# Patient Record
Sex: Female | Born: 1960 | Race: Black or African American | Hispanic: No | Smoking: Never smoker
Health system: Southern US, Community
[De-identification: ages and names within clinical notes are randomized; demographics above are authoritative.]

## PROBLEM LIST (undated history)

## (undated) DIAGNOSIS — N951 Menopausal and female climacteric states: Secondary | ICD-10-CM

## (undated) DIAGNOSIS — IMO0002 Reserved for concepts with insufficient information to code with codable children: Secondary | ICD-10-CM

## (undated) HISTORY — PX: THROAT SURGERY: SHX803

## (undated) HISTORY — DX: Reserved for concepts with insufficient information to code with codable children: IMO0002

## (undated) HISTORY — DX: Menopausal and female climacteric states: N95.1

---

## 2001-08-03 ENCOUNTER — Encounter: Payer: Self-pay | Admitting: *Deleted

## 2001-08-03 ENCOUNTER — Emergency Department (HOSPITAL_COMMUNITY): Admission: EM | Admit: 2001-08-03 | Discharge: 2001-08-03 | Payer: Self-pay | Admitting: *Deleted

## 2001-08-18 ENCOUNTER — Encounter (HOSPITAL_COMMUNITY): Admission: RE | Admit: 2001-08-18 | Discharge: 2001-09-17 | Payer: Self-pay | Admitting: Preventative Medicine

## 2001-08-19 ENCOUNTER — Other Ambulatory Visit: Admission: RE | Admit: 2001-08-19 | Discharge: 2001-08-19 | Payer: Self-pay | Admitting: Family Medicine

## 2001-08-21 ENCOUNTER — Encounter: Payer: Self-pay | Admitting: Family Medicine

## 2001-08-21 ENCOUNTER — Ambulatory Visit (HOSPITAL_COMMUNITY): Admission: RE | Admit: 2001-08-21 | Discharge: 2001-08-21 | Payer: Self-pay | Admitting: Specialist

## 2001-11-13 ENCOUNTER — Emergency Department (HOSPITAL_COMMUNITY): Admission: EM | Admit: 2001-11-13 | Discharge: 2001-11-13 | Payer: Self-pay | Admitting: *Deleted

## 2002-08-25 ENCOUNTER — Ambulatory Visit (HOSPITAL_COMMUNITY): Admission: RE | Admit: 2002-08-25 | Discharge: 2002-08-25 | Payer: Self-pay | Admitting: Family Medicine

## 2002-08-25 ENCOUNTER — Encounter: Payer: Self-pay | Admitting: Family Medicine

## 2002-09-01 ENCOUNTER — Encounter: Payer: Self-pay | Admitting: Family Medicine

## 2002-09-01 ENCOUNTER — Ambulatory Visit (HOSPITAL_COMMUNITY): Admission: RE | Admit: 2002-09-01 | Discharge: 2002-09-01 | Payer: Self-pay | Admitting: Family Medicine

## 2003-09-07 ENCOUNTER — Ambulatory Visit (HOSPITAL_COMMUNITY): Admission: RE | Admit: 2003-09-07 | Discharge: 2003-09-07 | Payer: Self-pay | Admitting: Family Medicine

## 2007-09-14 ENCOUNTER — Other Ambulatory Visit: Admission: RE | Admit: 2007-09-14 | Discharge: 2007-09-14 | Payer: Self-pay | Admitting: Obstetrics and Gynecology

## 2007-09-22 ENCOUNTER — Ambulatory Visit (HOSPITAL_COMMUNITY): Admission: RE | Admit: 2007-09-22 | Discharge: 2007-09-22 | Payer: Self-pay | Admitting: Obstetrics and Gynecology

## 2008-10-24 ENCOUNTER — Other Ambulatory Visit: Admission: RE | Admit: 2008-10-24 | Discharge: 2008-10-24 | Payer: Self-pay | Admitting: Obstetrics and Gynecology

## 2009-12-06 ENCOUNTER — Other Ambulatory Visit: Admission: RE | Admit: 2009-12-06 | Discharge: 2009-12-06 | Payer: Self-pay | Admitting: Obstetrics and Gynecology

## 2009-12-18 ENCOUNTER — Ambulatory Visit (HOSPITAL_COMMUNITY): Admission: RE | Admit: 2009-12-18 | Discharge: 2009-12-18 | Payer: Self-pay | Admitting: Internal Medicine

## 2010-10-13 ENCOUNTER — Encounter: Payer: Self-pay | Admitting: Family Medicine

## 2011-12-02 ENCOUNTER — Other Ambulatory Visit (HOSPITAL_COMMUNITY)
Admission: RE | Admit: 2011-12-02 | Discharge: 2011-12-02 | Disposition: A | Discharge status: Home / Self Care | Payer: BC Managed Care – PPO | Source: Ambulatory Visit | Attending: Obstetrics and Gynecology | Admitting: Obstetrics and Gynecology

## 2011-12-02 ENCOUNTER — Other Ambulatory Visit: Payer: Self-pay | Admitting: Adult Health

## 2011-12-02 DIAGNOSIS — Z01419 Encounter for gynecological examination (general) (routine) without abnormal findings: Secondary | ICD-10-CM | POA: Insufficient documentation

## 2014-01-31 ENCOUNTER — Ambulatory Visit (INDEPENDENT_AMBULATORY_CARE_PROVIDER_SITE_OTHER): Payer: BC Managed Care – PPO | Admitting: Adult Health

## 2014-01-31 ENCOUNTER — Encounter: Payer: Self-pay | Admitting: Adult Health

## 2014-01-31 ENCOUNTER — Other Ambulatory Visit (HOSPITAL_COMMUNITY)
Admission: RE | Admit: 2014-01-31 | Discharge: 2014-01-31 | Disposition: A | Discharge status: Home / Self Care | Payer: BC Managed Care – PPO | Source: Ambulatory Visit | Attending: Adult Health | Admitting: Adult Health

## 2014-01-31 VITALS — BP 110/62 | HR 74 | Ht 62.0 in | Wt 148.0 lb

## 2014-01-31 DIAGNOSIS — Z01419 Encounter for gynecological examination (general) (routine) without abnormal findings: Secondary | ICD-10-CM | POA: Insufficient documentation

## 2014-01-31 DIAGNOSIS — N951 Menopausal and female climacteric states: Secondary | ICD-10-CM

## 2014-01-31 DIAGNOSIS — R87810 Cervical high risk human papillomavirus (HPV) DNA test positive: Secondary | ICD-10-CM | POA: Insufficient documentation

## 2014-01-31 DIAGNOSIS — Z1151 Encounter for screening for human papillomavirus (HPV): Secondary | ICD-10-CM | POA: Insufficient documentation

## 2014-01-31 DIAGNOSIS — Z1212 Encounter for screening for malignant neoplasm of rectum: Secondary | ICD-10-CM

## 2014-01-31 DIAGNOSIS — Z139 Encounter for screening, unspecified: Secondary | ICD-10-CM

## 2014-01-31 HISTORY — DX: Menopausal and female climacteric states: N95.1

## 2014-01-31 LAB — HEMOCCULT GUIAC POC 1CARD (OFFICE): Fecal Occult Blood, POC: NEGATIVE

## 2014-01-31 NOTE — Progress Notes (Signed)
Patient ID: Sarah Kirby L Kirby, female   DOB: March 24, 1961, 53 y.o.   MRN: [id number] History of Present Illness:  Sarah Kirby is a 53 year old black female in for a pap and physical.She is skipping periods and having a few hot flashes.  Current Medications, Allergies, Past Medical History, Past Surgical History, Family History and Social History were reviewed in [facility] electronic medical record.     Review of Systems: Patient denies any headaches, blurred vision, shortness of breath, chest pain, abdominal pain, problems with bowel movements, urination, or intercourse. No joint pain or mood swings, is working 12 hours.See HPI.    Physical Exam:BP 110/62  Pulse 74  Ht 5\' 2"  (1.575 m)  Wt 148 lb (67.132 kg)  BMI 27.06 kg/m2  LMP 12/01/2013 General:  Well developed, well nourished, no acute distress Skin:  Warm and dry Neck:  Midline trachea, normal thyroid Lungs; Clear to auscultation bilaterally Breast:  No dominant palpable mass, retraction, or nipple discharge Cardiovascular: Regular rate and rhythm Abdomen:  Soft, non tender, no hepatosplenomegaly Pelvic:  External genitalia is normal in appearance.  The vagina is normal in appearance. The cervix is bulbous.Pap with HPV performed.  Uterus is felt to be normal size, shape, and contour.  No                adnexal masses or tenderness noted. Rectal: Good sphincter tone, no polyps, or hemorrhoids felt.  Hemoccult negative. Extremities:  No swelling or varicosities noted Psych:  No mood changes, alert and cooperative,seems happy Discussed menopausal symptoms, will just watch for now.  Impression: Yearly gyn exam Peri menopausal symptoms    Plan: Physical in 1 year Mammogram yearly  Referred to Dr Darrick PennaFields for colonoscopy Had labs at work,TC 181,trig 56,HDL59,ration 3.1,VLDL 11,LDL 111,BS 78(12/15/13) Review handouts on perimenopause and menopause

## 2014-01-31 NOTE — Patient Instructions (Signed)
Menopause Menopause is the normal time of life when menstrual periods stop completely. Menopause is complete when you have missed 12 consecutive menstrual periods. It usually occurs between the ages of 48 years and 55 years. Very rarely does a woman develop menopause before the age of 40 years. At menopause, your ovaries stop producing the female hormones estrogen and progesterone. This can cause undesirable symptoms and also affect your health. Sometimes the symptoms may occur 4 5 years before the menopause begins. There is no relationship between menopause and:  Oral contraceptives.  Number of children you had.  Race.  The age your menstrual periods started (menarche). Heavy smokers and very thin women may develop menopause earlier in life. CAUSES  The ovaries stop producing the female hormones estrogen and progesterone.  Other causes include:  Surgery to remove both ovaries.  The ovaries stop functioning for no known reason.  Tumors of the pituitary gland in the brain.  Medical disease that affects the ovaries and hormone production.  Radiation treatment to the abdomen or pelvis.  Chemotherapy that affects the ovaries. SYMPTOMS   Hot flashes.  Night sweats.  Decrease in sex drive.  Vaginal dryness and thinning of the vagina causing painful intercourse.  Dryness of the skin and developing wrinkles.  Headaches.  Tiredness.  Irritability.  Memory problems.  Weight gain.  Bladder infections.  Hair growth of the face and chest.  Infertility. More serious symptoms include:  Loss of bone (osteoporosis) causing breaks (fractures).  Depression.  Hardening and narrowing of the arteries (atherosclerosis) causing heart attacks and strokes. DIAGNOSIS   When the menstrual periods have stopped for 12 straight months.  Physical exam.  Hormone studies of the blood. TREATMENT  There are many treatment choices and nearly as many questions about them. The  decisions to treat or not to treat menopausal changes is an individual choice made with your health care provider. Your health care provider can discuss the treatments with you. Together, you can decide which treatment will work best for you. Your treatment choices may include:   Hormone therapy (estrogen and progesterone).  Non-hormonal medicines.  Treating the individual symptoms with medicine (for example antidepressants for depression).  Herbal medicines that may help specific symptoms.  Counseling by a psychiatrist or psychologist.  Group therapy.  Lifestyle changes including:  Eating healthy.  Regular exercise.  Limiting caffeine and alcohol.  Stress management and meditation.  No treatment. HOME CARE INSTRUCTIONS   Take the medicine your health care provider gives you as directed.  Get plenty of sleep and rest.  Exercise regularly.  Eat a diet that contains calcium (good for the bones) and soy products (acts like estrogen hormone).  Avoid alcoholic beverages.  Do not smoke.  If you have hot flashes, dress in layers.  Take supplements, calcium, and vitamin D to strengthen bones.  You can use over-the-counter lubricants or moisturizers for vaginal dryness.  Group therapy is sometimes very helpful.  Acupuncture may be helpful in some cases. SEEK MEDICAL CARE IF:   You are not sure you are in menopause.  You are having menopausal symptoms and need advice and treatment.  You are still having menstrual periods after age 55 years.  You have pain with intercourse.  Menopause is complete (no menstrual period for 12 months) and you develop vaginal bleeding.  You need a referral to a specialist (gynecologist, psychiatrist, or psychologist) for treatment. SEEK IMMEDIATE MEDICAL CARE IF:   You have severe depression.  You have excessive vaginal bleeding.    You fell and think you have a broken bone.  You have pain when you urinate.  You develop leg or  chest pain.  You have a fast pounding heart beat (palpitations).  You have severe headaches.  You develop vision problems.  You feel a lump in your breast.  You have abdominal pain or severe indigestion. Document Released: 11/30/2003 Document Revised: 05/12/2013 Document Reviewed: 04/08/2013 [facility] Patient Information 2014 [employer], [place]. Perimenopause Perimenopause is the time when your body begins to move into the menopause (no menstrual period for 12 straight months). It is a natural process. Perimenopause can begin 2 8 years before the menopause and usually lasts for 1 year after the menopause. During this time, your ovaries may or may not produce an egg. The ovaries vary in their production of estrogen and progesterone hormones each month. This can cause irregular menstrual periods, difficulty getting pregnant, vaginal bleeding between periods, and uncomfortable symptoms. CAUSES  Irregular production of the ovarian hormones, estrogen and progesterone, and not ovulating every month.  Other causes include:  Tumor of the pituitary gland in the brain.  Medical disease that affects the ovaries.  Radiation treatment.  Chemotherapy.  Unknown causes.  Heavy smoking and excessive alcohol intake can bring on perimenopause sooner. SIGNS AND SYMPTOMS   Hot flashes.  Night sweats.  Irregular menstrual periods.  Decreased sex drive.  Vaginal dryness.  Headaches.  Mood swings.  Depression.  Memory problems.  Irritability.  Tiredness.  Weight gain.  Trouble getting pregnant.  The beginning of losing bone cells (osteoporosis).  The beginning of hardening of the arteries (atherosclerosis). DIAGNOSIS  Your health care provider will make a diagnosis by analyzing your age, menstrual history, and symptoms. He or she will do a physical exam and note any changes in your body, especially your female organs. Female hormone tests may or may not be helpful depending on the  amount of female hormones you produce and when you produce them. However, other hormone tests may be helpful to rule out other problems. TREATMENT  In some cases, no treatment is needed. The decision on whether treatment is necessary during the perimenopause should be made by you and your health care provider based on how the symptoms are affecting you and your lifestyle. Various treatments are available, such as:  Treating individual symptoms with a specific medicine for that symptom.  Herbal medicines that can help specific symptoms.  Counseling.  Group therapy. HOME CARE INSTRUCTIONS   Keep track of your menstrual periods (when they occur, how heavy they are, how long between periods, and how long they last) as well as your symptoms and when they started.  Only take over-the-counter or prescription medicines as directed by your health care provider.  Sleep and rest.  Exercise.  Eat a diet that contains calcium (good for your bones) and soy (acts like the estrogen hormone).  Do not smoke.  Avoid alcoholic beverages.  Take vitamin supplements as recommended by your health care provider. Taking vitamin E may help in certain cases.  Take calcium and vitamin D supplements to help prevent bone loss.  Group therapy is sometimes helpful.  Acupuncture may help in some cases. SEEK MEDICAL CARE IF:   You have questions about any symptoms you are having.  You need a referral to a specialist (gynecologist, psychiatrist, or psychologist). SEEK IMMEDIATE MEDICAL CARE IF:   You have vaginal bleeding.  Your period lasts longer than 8 days.  Your periods are recurring sooner than 21 days.  You  have bleeding after intercourse.  You have severe depression.  You have pain when you urinate.  You have severe headaches.  You have vision problems. Document Released: 10/17/2004 Document Revised: 06/30/2013 Document Reviewed: 04/08/2013 [facility] Patient Information 2014 [place],  [place]. Physical in 1 year Mammogram yearly Refer to Dr fields for colonosopy

## 2014-02-01 ENCOUNTER — Telehealth: Payer: Self-pay | Admitting: Adult Health

## 2014-02-01 NOTE — Telephone Encounter (Signed)
Left message to call about pap 

## 2014-02-02 ENCOUNTER — Telehealth: Payer: Self-pay | Admitting: Adult Health

## 2014-02-02 NOTE — Telephone Encounter (Signed)
Pt aware pap negative but +HPV will repeat pap in 1 year

## 2014-02-02 NOTE — Telephone Encounter (Signed)
Left message to call about pap 

## 2014-02-03 ENCOUNTER — Telehealth: Payer: Self-pay | Admitting: Adult Health

## 2014-02-04 NOTE — Telephone Encounter (Signed)
Pt states has further questions concerning results of pap showing HPV positive. Informed pt pap results were negative for any cell changes or malignancy but did detect the virus on the tissues, however her body can actually work to resolve the virus would need to f/u with a pap next year as a f/u.Pt verbalized understanding.

## 2014-07-25 ENCOUNTER — Encounter: Payer: Self-pay | Admitting: Adult Health

## 2014-08-02 ENCOUNTER — Other Ambulatory Visit: Payer: Self-pay | Admitting: Adult Health

## 2014-08-02 DIAGNOSIS — Z1231 Encounter for screening mammogram for malignant neoplasm of breast: Secondary | ICD-10-CM

## 2014-08-22 ENCOUNTER — Ambulatory Visit (HOSPITAL_COMMUNITY)
Admission: RE | Admit: 2014-08-22 | Discharge: 2014-08-22 | Disposition: A | Discharge status: Home / Self Care | Payer: BC Managed Care – PPO | Source: Ambulatory Visit | Attending: Adult Health | Admitting: Adult Health

## 2014-08-22 DIAGNOSIS — Z1231 Encounter for screening mammogram for malignant neoplasm of breast: Secondary | ICD-10-CM | POA: Insufficient documentation

## 2015-03-28 ENCOUNTER — Encounter: Payer: Self-pay | Admitting: Adult Health

## 2015-03-28 ENCOUNTER — Ambulatory Visit (INDEPENDENT_AMBULATORY_CARE_PROVIDER_SITE_OTHER): Payer: BLUE CROSS/BLUE SHIELD | Admitting: Adult Health

## 2015-03-28 ENCOUNTER — Other Ambulatory Visit (HOSPITAL_COMMUNITY)
Admission: RE | Admit: 2015-03-28 | Discharge: 2015-03-28 | Disposition: A | Discharge status: Home / Self Care | Payer: BLUE CROSS/BLUE SHIELD | Source: Ambulatory Visit | Attending: Adult Health | Admitting: Adult Health

## 2015-03-28 VITALS — BP 110/68 | HR 60 | Ht 62.5 in | Wt 137.5 lb

## 2015-03-28 DIAGNOSIS — IMO0002 Reserved for concepts with insufficient information to code with codable children: Secondary | ICD-10-CM

## 2015-03-28 DIAGNOSIS — Z01419 Encounter for gynecological examination (general) (routine) without abnormal findings: Secondary | ICD-10-CM | POA: Insufficient documentation

## 2015-03-28 DIAGNOSIS — Z1212 Encounter for screening for malignant neoplasm of rectum: Secondary | ICD-10-CM

## 2015-03-28 DIAGNOSIS — Z1151 Encounter for screening for human papillomavirus (HPV): Secondary | ICD-10-CM | POA: Diagnosis present

## 2015-03-28 HISTORY — DX: Reserved for concepts with insufficient information to code with codable children: IMO0002

## 2015-03-28 LAB — HEMOCCULT GUIAC POC 1CARD (OFFICE): FECAL OCCULT BLD: NEGATIVE

## 2015-03-28 NOTE — Progress Notes (Signed)
Patient ID: Sarah HartiganCherry L Kirby, female   DOB: 1961/04/01, 54 y.o.   MRN: [id number] History of Present Illness: Sarah SaxonCherry is a [age] year old black female in for well woman gyn exam and pap, her pap last year was normal with +HPV. [place]  Current Medications, Allergies, Past Medical History, Past Surgical History, Family History and Social History were reviewed in [facility] electronic medical record.     Review of Systems: Patient denies any headaches, hearing loss, fatigue, blurred vision, shortness of breath, chest pain, abdominal pain, problems with bowel movements, urination, or intercourse. No joint pain or mood swings.    Physical Exam:BP 110/68 mmHg  Pulse 60  Ht 5' 2.5" (1.588 m)  Wt 137 lb 8 oz (62.37 kg)  BMI 24.73 kg/m2  LMP 08/22/2014 General:  Well developed, well nourished, no acute distress Skin:  Warm and dry Neck:  Midline trachea, normal thyroid, good ROM, no lymphadenopathy Lungs; Clear to auscultation bilaterally Breast:  No dominant palpable mass, retraction, or nipple discharge Cardiovascular: Regular rate and rhythm Abdomen:  Soft, non tender, no hepatosplenomegaly Pelvic:  External genitalia is normal in appearance, no lesions.  The vagina is normal in appearance. Urethra has no lesions or masses. The cervix is bulbous,frible with EC brush, pap with HPV performed.  Uterus is felt to be normal size, shape, and contour.  No adnexal masses or tenderness noted.Bladder is non tender, no masses felt. Rectal: Good sphincter tone, no polyps, or hemorrhoids felt.  Hemoccult negative. 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

## 2015-03-28 NOTE — Patient Instructions (Signed)
Physical in 1 year Mammogram yearly Colonoscopy advised will talk when labs back

## 2015-03-29 LAB — CBC
HEMATOCRIT: 42.8 % (ref 34.0–46.6)
HEMOGLOBIN: 14.4 g/dL (ref 11.1–15.9)
MCH: 29.9 pg (ref 26.6–33.0)
MCHC: 33.6 g/dL (ref 31.5–35.7)
MCV: 89 fL (ref 79–97)
Platelets: 157 10*3/uL (ref 150–379)
RBC: 4.82 x10E6/uL (ref 3.77–5.28)
RDW: 13.6 % (ref 12.3–15.4)
WBC: 7.9 10*3/uL (ref 3.4–10.8)

## 2015-03-29 LAB — COMPREHENSIVE METABOLIC PANEL
ALK PHOS: 83 IU/L (ref 39–117)
ALT: 19 IU/L (ref 0–32)
AST: 19 IU/L (ref 0–40)
Albumin/Globulin Ratio: 1.4 (ref 1.1–2.5)
Albumin: 4.7 g/dL (ref 3.5–5.5)
BILIRUBIN TOTAL: 0.5 mg/dL (ref 0.0–1.2)
BUN / CREAT RATIO: 12 (ref 9–23)
BUN: 9 mg/dL (ref 6–24)
CHLORIDE: 102 mmol/L (ref 97–108)
CO2: 23 mmol/L (ref 18–29)
CREATININE: 0.77 mg/dL (ref 0.57–1.00)
Calcium: 9.3 mg/dL (ref 8.7–10.2)
GFR calc Af Amer: 102 mL/min/{1.73_m2} (ref >59)
GFR calc non Af Amer: 88 mL/min/{1.73_m2} (ref >59)
Globulin, Total: 3.3 g/dL (ref 1.5–4.5)
Glucose: 84 mg/dL (ref 65–99)
POTASSIUM: 4.1 mmol/L (ref 3.5–5.2)
SODIUM: 142 mmol/L (ref 134–144)
Total Protein: 8 g/dL (ref 6.0–8.5)

## 2015-03-29 LAB — LIPID PANEL
CHOL/HDL RATIO: 3.1 ratio (ref 0.0–4.4)
Cholesterol, Total: 177 mg/dL (ref 100–199)
HDL: 58 mg/dL (ref >39)
LDL Calculated: 100 mg/dL — ABNORMAL HIGH (ref 0–99)
Triglycerides: 95 mg/dL (ref 0–149)
VLDL Cholesterol Cal: 19 mg/dL (ref 5–40)

## 2015-03-29 LAB — TSH: TSH: 1.02 u[IU]/mL (ref 0.450–4.500)

## 2015-03-30 ENCOUNTER — Telehealth: Payer: Self-pay | Admitting: Adult Health

## 2015-03-30 LAB — CYTOLOGY - PAP

## 2015-03-30 NOTE — Telephone Encounter (Signed)
Left message labs good. 

## 2015-05-12 ENCOUNTER — Telehealth: Payer: Self-pay | Admitting: Adult Health

## 2015-05-12 NOTE — Telephone Encounter (Signed)
Left message I called, call me back on Monday

## 2015-05-22 ENCOUNTER — Telehealth: Payer: Self-pay | Admitting: Adult Health

## 2015-05-22 NOTE — Telephone Encounter (Signed)
Did not get seen at [facility],wants PCP, number given for Dr Jacqualyn Posey knot near rectum, not sure if hemorrhoid or not

## 2015-05-22 NOTE — Telephone Encounter (Signed)
Going to [facility]

## 2015-11-20 ENCOUNTER — Other Ambulatory Visit: Payer: Self-pay | Admitting: Adult Health

## 2015-11-20 DIAGNOSIS — Z1231 Encounter for screening mammogram for malignant neoplasm of breast: Secondary | ICD-10-CM

## 2015-12-11 ENCOUNTER — Ambulatory Visit (HOSPITAL_COMMUNITY)
Admission: RE | Admit: 2015-12-11 | Discharge: 2015-12-11 | Disposition: A | Discharge status: Home / Self Care | Payer: BLUE CROSS/BLUE SHIELD | Source: Ambulatory Visit | Attending: Adult Health | Admitting: Adult Health

## 2015-12-11 DIAGNOSIS — Z1231 Encounter for screening mammogram for malignant neoplasm of breast: Secondary | ICD-10-CM

## 2016-04-08 ENCOUNTER — Other Ambulatory Visit: Payer: BLUE CROSS/BLUE SHIELD | Admitting: Adult Health

## 2016-05-28 ENCOUNTER — Encounter: Payer: Self-pay | Admitting: Internal Medicine

## 2016-06-21 ENCOUNTER — Encounter: Payer: Self-pay | Admitting: Gastroenterology

## 2016-06-21 ENCOUNTER — Ambulatory Visit (INDEPENDENT_AMBULATORY_CARE_PROVIDER_SITE_OTHER): Payer: BLUE CROSS/BLUE SHIELD | Admitting: Gastroenterology

## 2016-06-21 DIAGNOSIS — R195 Other fecal abnormalities: Secondary | ICD-10-CM | POA: Insufficient documentation

## 2016-06-21 NOTE — Progress Notes (Signed)
Primary Care Physician:  Arlyss QueenSELVIDGE,WILLIAM M, MD Primary Gastroenterologist:  Dr. Jena Gaussourk   Chief Complaint  Patient presents with  . Colonoscopy    HPI:   Sarah Kirby is a 55 y.o. female presenting today at the request of her PCP for heme positive stool. She has never had a colonoscopy.  Has not seen bright red blood per rectum or melena. No changes in bowel habits, constipation, or diarrhea. No abdominal pain. Good appetite. Works all the time and states she had lost some weight with that but it has gotten to a baseline of where she normally stays. States she had been working 12 hour shifts and had lost weight but now back to her baseline of 140s. No upper GI symptoms. No prior colonoscopy. No FH of colon cancer or colon polyps.   Past Medical History:  Diagnosis Date  . Peri-menopausal 01/31/2014  . 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 THROAT SURGERY      No current outpatient prescriptions on file.   No current facility-administered medications for this visit.     Allergies as of 06/21/2016 - Review Complete 06/21/2016  Allergen Reaction Noted  . 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 Heart attack Mother   . Heart attack Father   . Birth defects Sister     down syndrome  . Birth defects Paternal Aunt   . Birth defects Paternal Aunt   . Birth defects Paternal Aunt     Social History   Social History  . Marital status: Divorced    Spouse name: N/A  . Number of children: N/A  . Years of education: N/A   Occupational History  . Not on file.   Social History Main Topics  . Smoking status: Never Smoker  . Smokeless tobacco: Never Used  . Alcohol use Yes     Comment: drinks wine in May  . Drug use: No  . Sexual activity: Not Currently    Birth control/ protection: Post-menopausal   Other Topics Concern  . Not on file    Social History Narrative  . No narrative on file    Review of Systems: Gen: Denies any fever, chills, fatigue, weight loss, lack of appetite.  CV: Denies chest pain, heart palpitations, peripheral edema, syncope.  Resp: Denies shortness of breath at rest or with exertion. Denies wheezing or cough.  GI: see HPI  GU : Denies urinary burning, urinary frequency, urinary hesitancy MS: Denies joint pain, muscle weakness, cramps, or limitation of movement.  Derm: Denies rash, itching, dry skin Psych: Denies depression, anxiety, memory loss, and confusion Heme: Denies bruising, bleeding, and enlarged lymph nodes.  Physical Exam: BP 112/70   Pulse 72   Temp 98 F (36.7 C) (Oral)   Ht 5' 2.5" (1.588 m)   Wt 141 lb 9.6 oz (64.2 kg)   LMP 08/22/2014   BMI 25.49 kg/m  General:   Alert and oriented. Pleasant and cooperative. Well-nourished and well-developed.  Head:  Normocephalic and atraumatic. Eyes:  Without icterus, sclera clear and conjunctiva pink.  Ears:  Normal auditory acuity. Nose:  No deformity, discharge,  or lesions. Mouth:  No deformity or lesions, oral mucosa pink.  Lungs:  Clear to auscultation bilaterally. No wheezes, rales, or rhonchi. No distress.  Heart:  S1, S2 present without murmurs appreciated.  Abdomen:  +BS, soft, non-tender and non-distended.  No HSM noted. No guarding or rebound. No masses appreciated.  Rectal:  Deferred  Msk:  Symmetrical without gross deformities. Normal posture. Extremities:  Without  edema. Neurologic:  Alert and  oriented x4;  grossly normal neurologically. Psych:  Alert and cooperative. Normal mood and affect.

## 2016-06-21 NOTE — Patient Instructions (Signed)
We have scheduled you for a colonoscopy with Dr. Rourk in the near future.  Further recommendations to follow!   

## 2016-06-21 NOTE — Assessment & Plan Note (Signed)
55 year old female with heme positive stool but no overt GI bleeding such as melena or hematochezia. No other concerning GI symptoms reported. No prior colonoscopy, family history of colon cancer or polyps.  Proceed with TCS with Dr. Jena Gaussourk in near future: the risks, benefits, and alternatives have been discussed with the patient in detail. The patient states understanding and desires to proceed.

## 2016-06-24 ENCOUNTER — Other Ambulatory Visit: Payer: Self-pay

## 2016-06-24 ENCOUNTER — Telehealth: Payer: Self-pay

## 2016-06-24 DIAGNOSIS — R195 Other fecal abnormalities: Secondary | ICD-10-CM

## 2016-06-24 MED ORDER — PEG 3350-KCL-NA BICARB-NACL 420 G PO SOLR: 4000.0000 mL | ORAL | 0 refills | Status: AC

## 2016-06-24 NOTE — Telephone Encounter (Signed)
Pt called to set-up TCS. TCS scheduled for 07/15/16 at 2:00 pm.

## 2016-06-24 NOTE — Progress Notes (Signed)
CC'D TO PCP °

## 2016-07-15 ENCOUNTER — Encounter (HOSPITAL_COMMUNITY): Payer: Self-pay | Admitting: *Deleted

## 2016-07-15 ENCOUNTER — Encounter (HOSPITAL_COMMUNITY)
Admission: RE | Disposition: A | Discharge status: Home / Self Care | Payer: Self-pay | Source: Ambulatory Visit | Attending: Internal Medicine

## 2016-07-15 ENCOUNTER — Ambulatory Visit (HOSPITAL_COMMUNITY)
Admission: RE | Admit: 2016-07-15 | Discharge: 2016-07-15 | Disposition: A | Discharge status: Home / Self Care | Payer: BLUE CROSS/BLUE SHIELD | Source: Ambulatory Visit | Attending: Internal Medicine | Admitting: Internal Medicine

## 2016-07-15 DIAGNOSIS — R195 Other fecal abnormalities: Secondary | ICD-10-CM | POA: Diagnosis not present

## 2016-07-15 HISTORY — PX: COLONOSCOPY: SHX5424

## 2016-07-15 LAB — HEMOGLOBIN AND HEMATOCRIT, BLOOD
HEMATOCRIT: 35.8 % — AB (ref 36.0–46.0)
Hemoglobin: 12.4 g/dL (ref 12.0–15.0)

## 2016-07-15 SURGERY — COLONOSCOPY
Anesthesia: Moderate Sedation

## 2016-07-15 MED ORDER — ONDANSETRON HCL 4 MG/2ML IJ SOLN
INTRAMUSCULAR | Status: DC | PRN
Start: 2016-07-15 — End: 2016-07-15
  Administered 2016-07-15: 4 mg via INTRAVENOUS

## 2016-07-15 MED ORDER — MIDAZOLAM HCL 5 MG/5ML IJ SOLN
INTRAMUSCULAR | Status: AC
  Filled 2016-07-15: qty 10

## 2016-07-15 MED ORDER — MEPERIDINE HCL 100 MG/ML IJ SOLN
INTRAMUSCULAR | Status: DC
Start: 2016-07-15 — End: 2016-07-15
  Filled 2016-07-15: qty 2

## 2016-07-15 MED ORDER — MIDAZOLAM HCL 5 MG/5ML IJ SOLN
INTRAMUSCULAR | Status: DC | PRN
  Administered 2016-07-15 (×2): 2 mg via INTRAVENOUS
  Administered 2016-07-15: 1 mg via INTRAVENOUS

## 2016-07-15 MED ORDER — ONDANSETRON HCL 4 MG/2ML IJ SOLN
INTRAMUSCULAR | Status: AC
  Filled 2016-07-15: qty 2

## 2016-07-15 MED ORDER — SODIUM CHLORIDE 0.9 % IV SOLN
INTRAVENOUS | Status: DC
  Administered 2016-07-15: 1000 mL via INTRAVENOUS

## 2016-07-15 MED ORDER — MEPERIDINE HCL 100 MG/ML IJ SOLN
INTRAMUSCULAR | Status: DC | PRN
  Administered 2016-07-15: 25 mg via INTRAVENOUS
  Administered 2016-07-15: 50 mg via INTRAVENOUS

## 2016-07-15 MED ORDER — STERILE WATER FOR IRRIGATION IR SOLN
Status: DC | PRN
  Administered 2016-07-15: 15:00:00

## 2016-07-15 NOTE — Discharge Instructions (Addendum)
°  Colonoscopy Discharge Instructions  Read the instructions outlined below and refer to this sheet in the next few weeks. These discharge instructions provide you with general information on caring for yourself after you leave the hospital. Your doctor may also give you specific instructions. While your treatment has been planned according to the most current medical practices available, unavoidable complications occasionally occur. If you have any problems or questions after discharge, call Dr. Jena Gaussourk at [phone]. ACTIVITY  You may resume your regular activity, but move at a slower pace for the next 24 hours.   Take frequent rest periods for the next 24 hours.   Walking will help get rid of the air and reduce the bloated feeling in your belly (abdomen).   No driving for 24 hours (because of the medicine (anesthesia) used during the test).    Do not sign any important legal documents or operate any machinery for 24 hours (because of the anesthesia used during the test).  NUTRITION  Drink plenty of fluids.   You may resume your normal diet as instructed by your doctor.   Begin with a light meal and progress to your normal diet. Heavy or fried foods are harder to digest and may make you feel sick to your stomach (nauseated).   Avoid alcoholic beverages for 24 hours or as instructed.  MEDICATIONS  You may resume your normal medications unless your doctor tells you otherwise.  WHAT YOU CAN EXPECT TODAY  Some feelings of bloating in the abdomen.   Passage of more gas than usual.   Spotting of blood in your stool or on the toilet paper.  IF YOU HAD POLYPS REMOVED DURING THE COLONOSCOPY:  No aspirin products for 7 days or as instructed.   No alcohol for 7 days or as instructed.   Eat a soft diet for the next 24 hours.  FINDING OUT THE RESULTS OF YOUR TEST Not all test results are available during your visit. If your test results are not back during the visit, make an appointment  with your caregiver to find out the results. Do not assume everything is normal if you have not heard from your caregiver or the medical facility. It is important for you to follow up on all of your test results.  SEEK IMMEDIATE MEDICAL ATTENTION IF:  You have more than a spotting of blood in your stool.   Your belly is swollen (abdominal distention).   You are nauseated or vomiting.   You have a temperature over 101.   You have abdominal pain or discomfort that is severe or gets worse throughout the day.    Repeat colonoscopy in 10 years for screening purposes  H&H today  Further recommendations to follow.

## 2016-07-15 NOTE — Op Note (Signed)
[facility] Patient Name: Sarah GamblerCherry Kirby Procedure Date: 07/15/2016 2:22 PM MRN: [id number] Date of Birth: [birth year] Attending MD: Sarah Pacobert Michael Chandrika Sandles , MD CSN: [id number] Age: 55 Admit Type: Outpatient Procedure:                Colonoscopy - diagnostic Indications:              Heme positive stool Providers:                Sarah Pacobert Michael Arlissa Monteverde, MD, Loma MessingLurae B. Patsy LagerAlbert RN, RN,                            Sarah Kirby, Technician Referring MD:              Medicines:                Midazolam 5 mg IV, Meperidine 75 mg IV Complications:            No immediate complications. Estimated Blood Loss:     Estimated blood loss: none. Procedure:                Pre-Anesthesia Assessment:                           - Prior to the procedure, a History and Physical                            was performed, and patient medications and                            allergies were reviewed. The patient's tolerance of                            previous anesthesia was also reviewed. The risks                            and benefits of the procedure and the sedation                            options and risks were discussed with the patient.                            All questions were answered, and informed consent                            was obtained. Prior Anticoagulants: The patient has                            taken no previous anticoagulant or antiplatelet                            agents. ASA Grade Assessment: II - A patient with                            mild systemic disease. After reviewing the risks  and benefits, the patient was deemed in                            satisfactory condition to undergo the procedure.                           After obtaining informed consent, the colonoscope                            was passed under direct vision. Throughout the                            procedure, the patient's blood pressure, pulse, and               oxygen saturations were monitored continuously. The                            EC-[id number]) scope was introduced through                            the anus and advanced to the the cecum, identified                            by appendiceal orifice and ileocecal valve. The                            [id number] ([id number]) scope was introduced through                            the and advanced to the. The colonoscopy was                            performed without difficulty. The patient tolerated                            the procedure well. The quality of the bowel                            preparation was adequate. The ileocecal valve,                            appendiceal orifice, and rectum were photographed.                            The colonoscopy was performed without difficulty.                            The patient tolerated the procedure well. The                            quality of the bowel preparation was adequate. The                            entire colon was well visualized. Scope  In: 2:41:48 PM Scope Out: 2:56:17 PM Scope Withdrawal Time: 0 hours 8 minutes 29 seconds  Total Procedure Duration: 0 hours 14 minutes 29 seconds  Findings:      The perianal and digital rectal examinations were normal.      The colon (entire examined portion) appeared normal.      The exam was otherwise without abnormality on direct and retroflexion       views. Impression:               - The entire examined colon is normal.                           - No specimens collected. Moderate Sedation:      Moderate (conscious) sedation was administered by the endoscopy nurse       and supervised by the endoscopist. The following parameters were       monitored: oxygen saturation, heart rate, blood pressure, and response       to care. Total physician intraservice time was 22 minutes. Recommendation:           - Patient has a contact number available for                             emergencies. The signs and symptoms of potential                            delayed complications were discussed with the                            patient. Return to normal activities tomorrow.                            Written discharge instructions were provided to the                            patient.                           - Resume previous diet.                           - Continue present medications. H&H today                           - Repeat colonoscopy in 10 years for screening                            purposes.                           - Return to GI office PRN. Procedure Code(s):        --- Professional ---                           [phone], Colonoscopy, flexible; diagnostic, including                            collection of specimen(s) by  brushing or washing,                            when performed (separate procedure)                           [id number], Moderate sedation services provided by the                            same physician or other qualified health care                            professional performing the diagnostic or                            therapeutic service that the sedation supports,                            requiring the presence of an independent trained                            observer to assist in the monitoring of the                            patient's level of consciousness and physiological                            status; initial 15 minutes of intraservice time,                            patient age 34 years or older Diagnosis Code(s):        --- Professional ---                           R19.5, Other fecal abnormalities CPT copyright 2016 [employer]. All rights reserved. The codes documented in this report are preliminary and upon coder review may  be revised to meet current compliance requirements. Sarah Kirby. Sarah Denardo, MD Sarah Pac, MD 07/15/2016 3:03:35 PM This report has been signed  electronically. Number of Addenda: 0

## 2016-07-15 NOTE — H&P (View-Only) (Signed)
Primary Care Physician:  Arlyss QueenSELVIDGE,WILLIAM M, MD Primary Gastroenterologist:  Dr. Jena Gaussourk   Chief Complaint  Patient presents with  . Colonoscopy    HPI:   Sarah Kirby is a 55 y.o. female presenting today at the request of her PCP for heme positive stool. She has never had a colonoscopy.  Has not seen bright red blood per rectum or melena. No changes in bowel habits, constipation, or diarrhea. No abdominal pain. Good appetite. Works all the time and states she had lost some weight with that but it has gotten to a baseline of where she normally stays. States she had been working 12 hour shifts and had lost weight but now back to her baseline of 140s. No upper GI symptoms. No prior colonoscopy. No FH of colon cancer or colon polyps.   Past Medical History:  Diagnosis Date  . Peri-menopausal 01/31/2014  . 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 THROAT SURGERY      No current outpatient prescriptions on file.   No current facility-administered medications for this visit.     Allergies as of 06/21/2016 - Review Complete 06/21/2016  Allergen Reaction Noted  . 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 Heart attack Mother   . Heart attack Father   . Birth defects Sister     down syndrome  . Birth defects Paternal Aunt   . Birth defects Paternal Aunt   . Birth defects Paternal Aunt     Social History   Social History  . Marital status: Divorced    Spouse name: N/A  . Number of children: N/A  . Years of education: N/A   Occupational History  . Not on file.   Social History Main Topics  . Smoking status: Never Smoker  . Smokeless tobacco: Never Used  . Alcohol use Yes     Comment: drinks wine in May  . Drug use: No  . Sexual activity: Not Currently    Birth control/ protection: Post-menopausal   Other Topics Concern  . Not on file    Social History Narrative  . No narrative on file    Review of Systems: Gen: Denies any fever, chills, fatigue, weight loss, lack of appetite.  CV: Denies chest pain, heart palpitations, peripheral edema, syncope.  Resp: Denies shortness of breath at rest or with exertion. Denies wheezing or cough.  GI: see HPI  GU : Denies urinary burning, urinary frequency, urinary hesitancy MS: Denies joint pain, muscle weakness, cramps, or limitation of movement.  Derm: Denies rash, itching, dry skin Psych: Denies depression, anxiety, memory loss, and confusion Heme: Denies bruising, bleeding, and enlarged lymph nodes.  Physical Exam: BP 112/70   Pulse 72   Temp 98 F (36.7 C) (Oral)   Ht 5' 2.5" (1.588 m)   Wt 141 lb 9.6 oz (64.2 kg)   LMP 08/22/2014   BMI 25.49 kg/m  General:   Alert and oriented. Pleasant and cooperative. Well-nourished and well-developed.  Head:  Normocephalic and atraumatic. Eyes:  Without icterus, sclera clear and conjunctiva pink.  Ears:  Normal auditory acuity. Nose:  No deformity, discharge,  or lesions. Mouth:  No deformity or lesions, oral mucosa pink.  Lungs:  Clear to auscultation bilaterally. No wheezes, rales, or rhonchi. No distress.  Heart:  S1, S2 present without murmurs appreciated.  Abdomen:  +BS, soft, non-tender and non-distended.  No HSM noted. No guarding or rebound. No masses appreciated.  Rectal:  Deferred  Msk:  Symmetrical without gross deformities. Normal posture. Extremities:  Without  edema. Neurologic:  Alert and  oriented x4;  grossly normal neurologically. Psych:  Alert and cooperative. Normal mood and affect.

## 2016-07-15 NOTE — Interval H&P Note (Signed)
History and Physical Interval Note:  07/15/2016 2:22 PM  Sarah Kirby  has presented today for surgery, with the diagnosis of heme positive stool  The various methods of treatment have been discussed with the patient and family. After consideration of risks, benefits and other options for treatment, the patient has consented to  Procedure(s) with comments: COLONOSCOPY (N/A) - 2:00 pm as a surgical intervention .  The patient's history has been reviewed, patient examined, no change in status, stable for surgery.  I have reviewed the patient's chart and labs.  Questions were answered to the patient's satisfaction.     No change. Diagnostic colonoscopy per plan. The risks, benefits, limitations, alternatives and imponderables have been reviewed with the patient. Questions have been answered. All parties are agreeable.  Eula Listenobert Rigby Leonhardt

## 2016-07-19 ENCOUNTER — Encounter (HOSPITAL_COMMUNITY): Payer: Self-pay | Admitting: Internal Medicine

## 2018-04-13 ENCOUNTER — Other Ambulatory Visit: Payer: Self-pay | Admitting: Obstetrics and Gynecology

## 2018-04-13 DIAGNOSIS — Z1231 Encounter for screening mammogram for malignant neoplasm of breast: Secondary | ICD-10-CM

## 2018-06-10 ENCOUNTER — Ambulatory Visit (HOSPITAL_COMMUNITY)
Admission: RE | Admit: 2018-06-10 | Discharge: 2018-06-10 | Disposition: A | Discharge status: Home / Self Care | Payer: BLUE CROSS/BLUE SHIELD | Source: Ambulatory Visit | Attending: Obstetrics and Gynecology | Admitting: Obstetrics and Gynecology

## 2018-06-10 DIAGNOSIS — Z1231 Encounter for screening mammogram for malignant neoplasm of breast: Secondary | ICD-10-CM | POA: Diagnosis not present

## 2020-03-16 ENCOUNTER — Other Ambulatory Visit (HOSPITAL_COMMUNITY): Payer: Self-pay | Admitting: Adult Health

## 2020-03-16 DIAGNOSIS — Z1231 Encounter for screening mammogram for malignant neoplasm of breast: Secondary | ICD-10-CM

## 2020-04-17 ENCOUNTER — Ambulatory Visit (HOSPITAL_COMMUNITY)
Admission: RE | Admit: 2020-04-17 | Discharge: 2020-04-17 | Disposition: A | Discharge status: Home / Self Care | Payer: BC Managed Care – PPO | Source: Ambulatory Visit | Attending: Adult Health | Admitting: Adult Health

## 2020-04-17 ENCOUNTER — Other Ambulatory Visit (HOSPITAL_COMMUNITY): Payer: Self-pay | Admitting: Student

## 2020-04-17 ENCOUNTER — Other Ambulatory Visit: Payer: Self-pay

## 2020-04-17 DIAGNOSIS — Z1231 Encounter for screening mammogram for malignant neoplasm of breast: Secondary | ICD-10-CM | POA: Diagnosis not present

## 2021-03-29 ENCOUNTER — Other Ambulatory Visit (HOSPITAL_COMMUNITY): Payer: Self-pay | Admitting: Student

## 2021-03-29 ENCOUNTER — Other Ambulatory Visit (HOSPITAL_COMMUNITY): Payer: Self-pay | Admitting: Family Medicine

## 2021-03-29 DIAGNOSIS — Z1231 Encounter for screening mammogram for malignant neoplasm of breast: Secondary | ICD-10-CM

## 2021-04-19 ENCOUNTER — Ambulatory Visit (HOSPITAL_COMMUNITY)
Admission: RE | Admit: 2021-04-19 | Discharge: 2021-04-19 | Disposition: A | Discharge status: Home / Self Care | Payer: BC Managed Care – PPO | Source: Ambulatory Visit | Attending: Family Medicine | Admitting: Family Medicine

## 2021-04-19 ENCOUNTER — Other Ambulatory Visit: Payer: Self-pay

## 2021-04-19 DIAGNOSIS — Z1231 Encounter for screening mammogram for malignant neoplasm of breast: Secondary | ICD-10-CM

## 2022-05-07 ENCOUNTER — Other Ambulatory Visit (HOSPITAL_COMMUNITY): Payer: Self-pay | Admitting: Family Medicine

## 2022-05-07 DIAGNOSIS — Z1231 Encounter for screening mammogram for malignant neoplasm of breast: Secondary | ICD-10-CM

## 2022-05-16 ENCOUNTER — Ambulatory Visit (HOSPITAL_COMMUNITY)
Admission: RE | Admit: 2022-05-16 | Discharge: 2022-05-16 | Disposition: A | Discharge status: Home / Self Care | Payer: BC Managed Care – PPO | Source: Ambulatory Visit | Attending: Family Medicine | Admitting: Family Medicine

## 2022-05-16 DIAGNOSIS — Z1231 Encounter for screening mammogram for malignant neoplasm of breast: Secondary | ICD-10-CM | POA: Insufficient documentation

## 2022-05-29 IMAGING — MG MM DIGITAL SCREENING BILAT W/ TOMO AND CAD
8 series · 8 of 24 positions shown · non-contrast
Comparison: Previous exam(s).

CLINICAL DATA: Screening.

EXAM:
DIGITAL SCREENING BILATERAL MAMMOGRAM WITH TOMOSYNTHESIS AND CAD
TECHNIQUE: Bilateral screening digital craniocaudal and mediolateral oblique
mammograms were obtained. Bilateral screening digital breast
tomosynthesis was performed. The images were evaluated with
computer-aided detection.

[R MLO synth-2D]
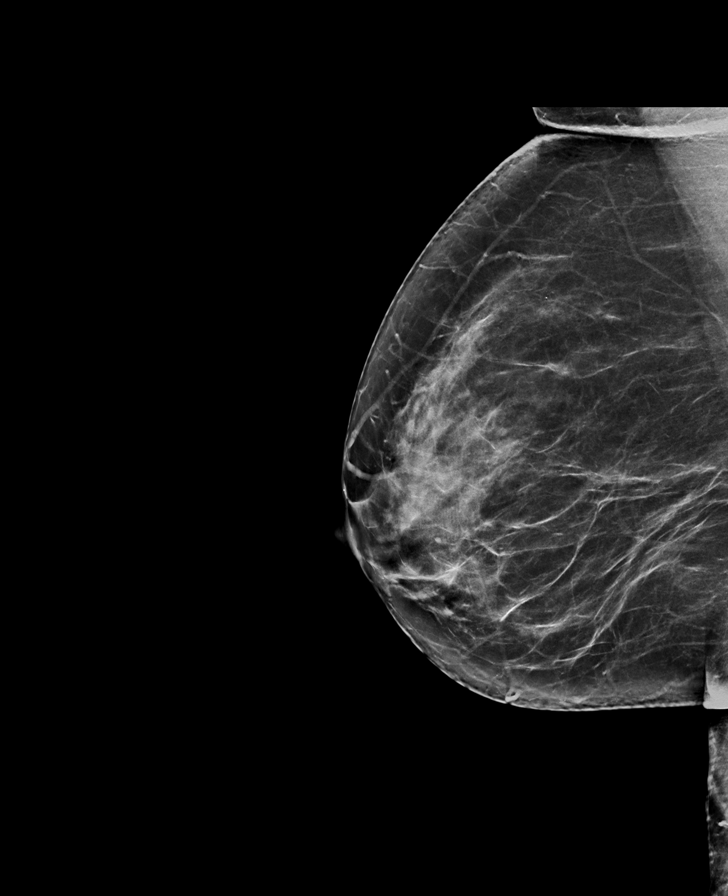

[L CC synth-2D]
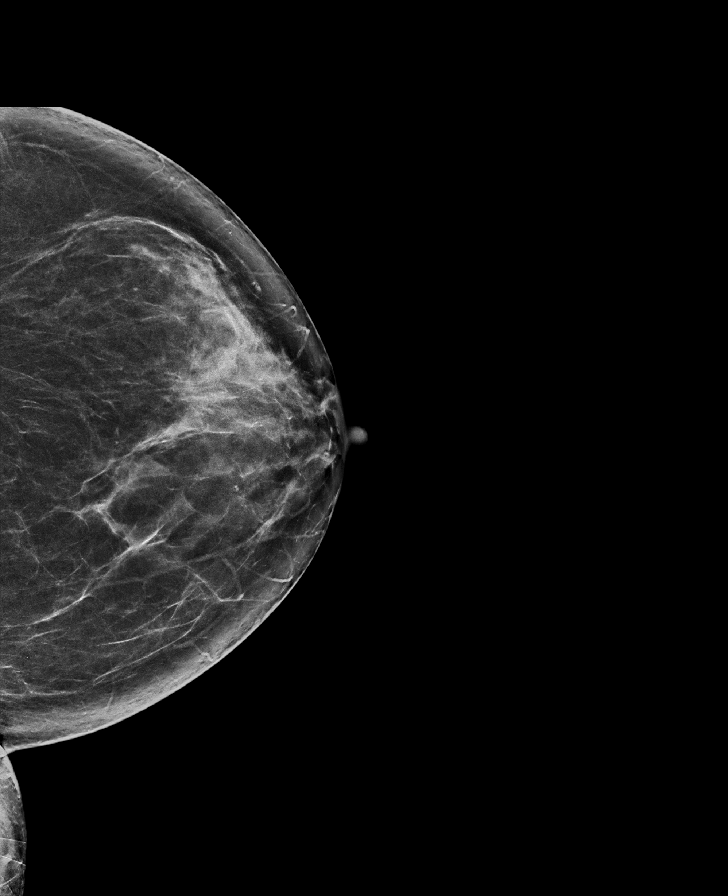

[R CC synth-2D]
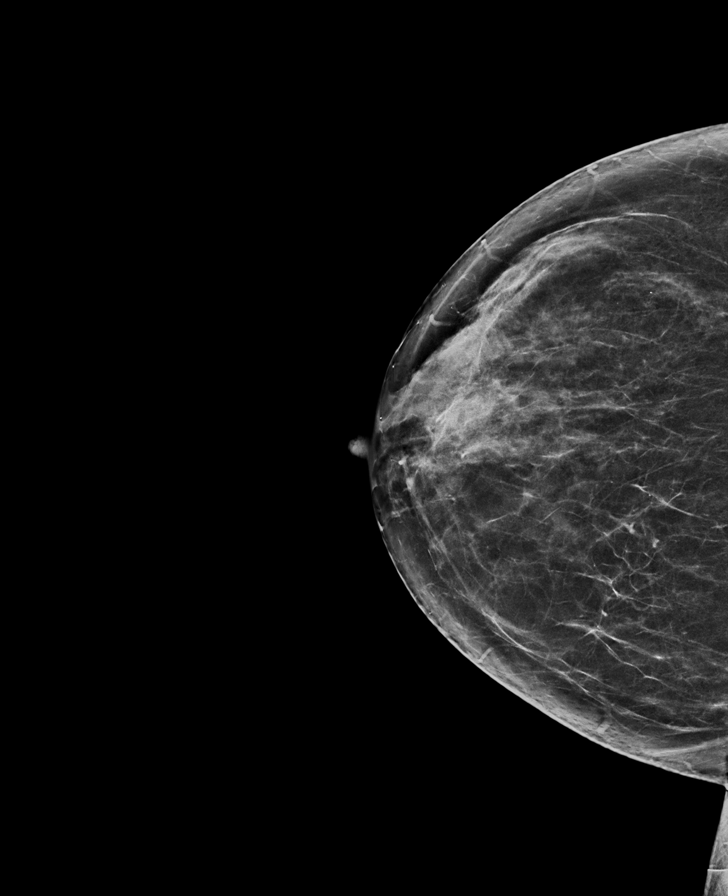

[L MLO synth-2D]
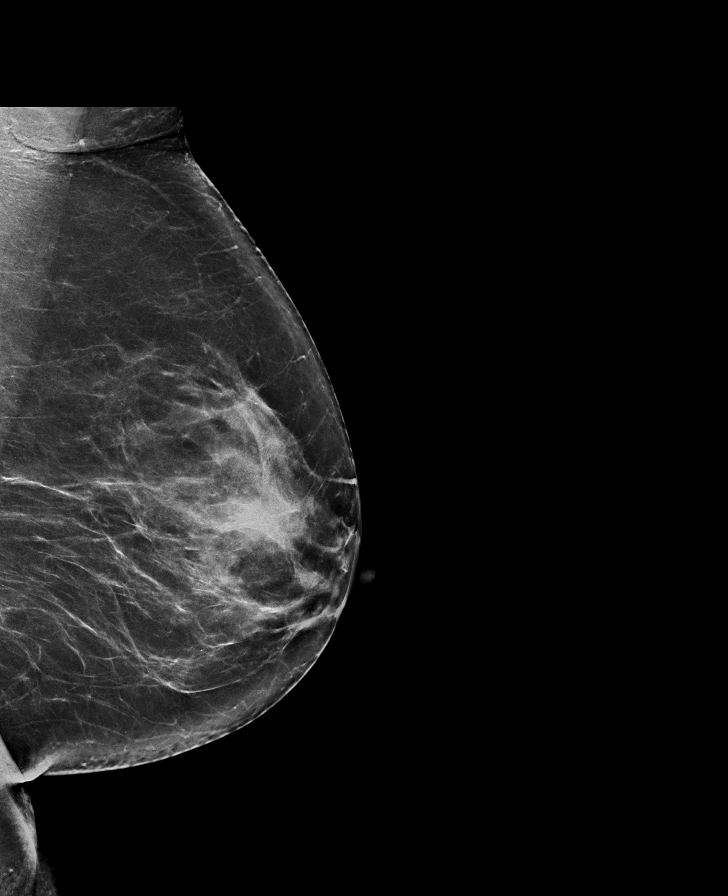

[L MLO tomo · tomo slice 41/80.0]
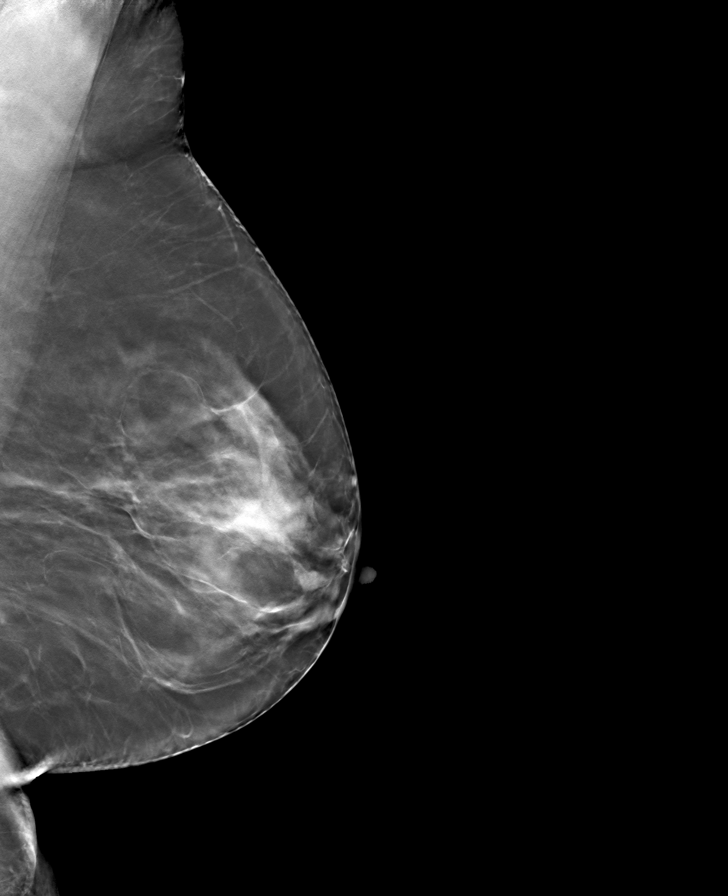

[L CC tomo · tomo slice 39/76.0]
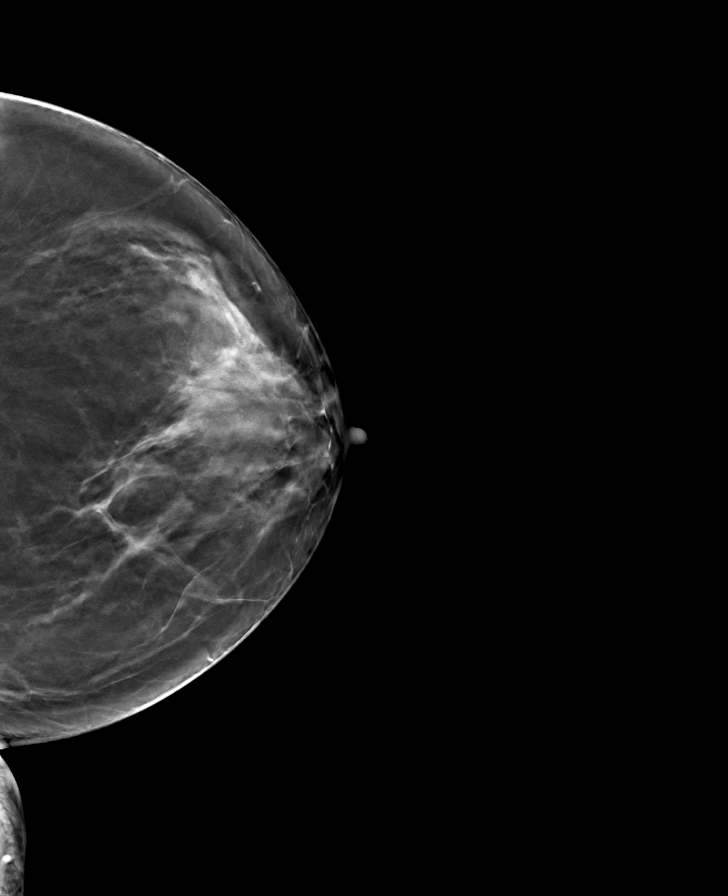

[R CC tomo · tomo slice 35/70.0]
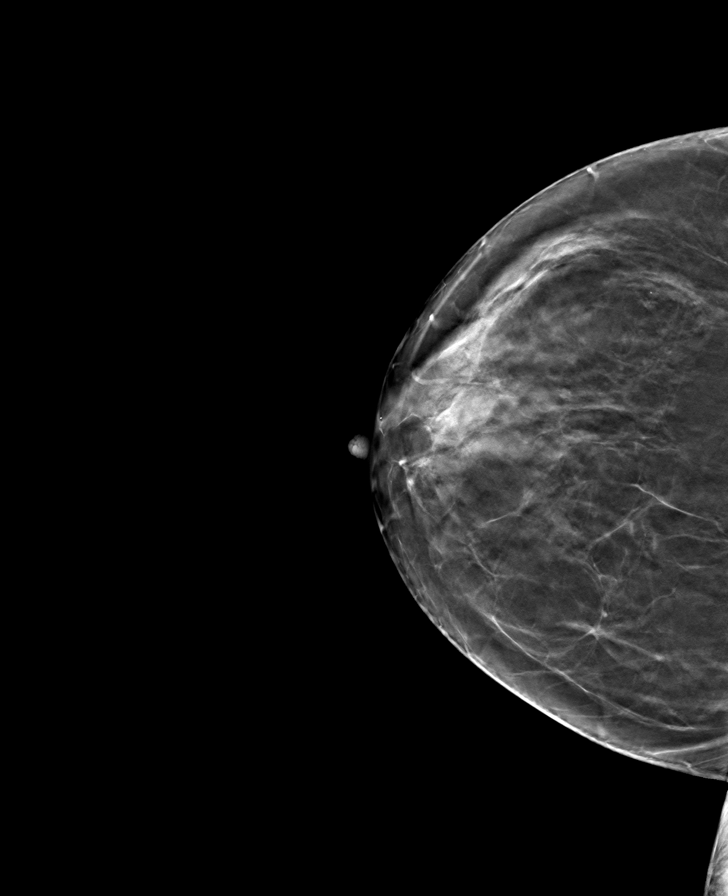

[R MLO tomo · tomo slice 41/80.0]
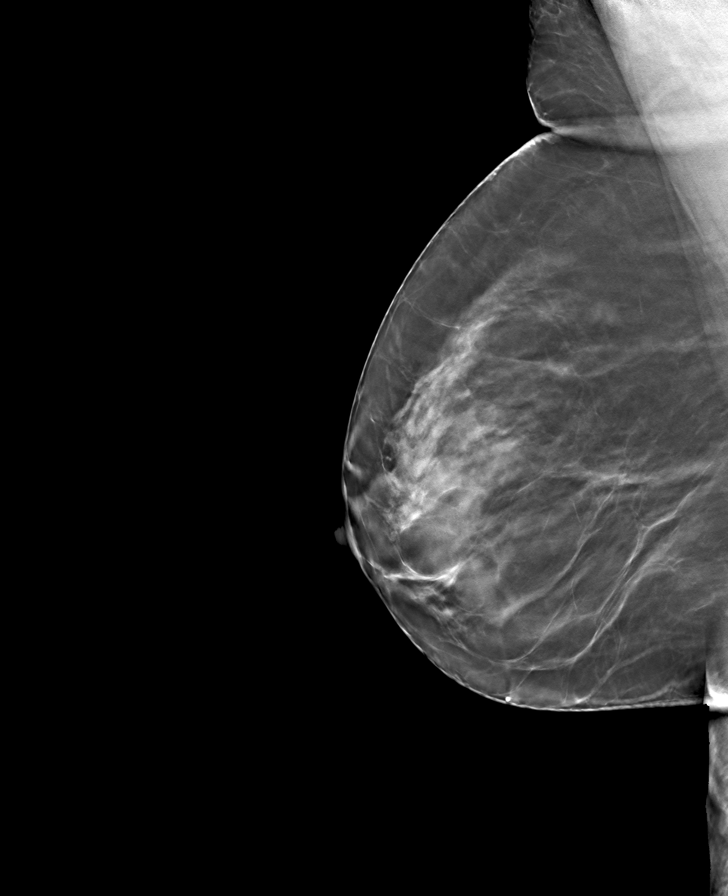

[8 of 24 positions shown; findings below may reference images not displayed]

ACR Breast Density Category c: The breast tissue is heterogeneously
dense, which may obscure small masses.
FINDINGS: There are no findings suspicious for malignancy.
IMPRESSION: No mammographic evidence of malignancy. A result letter of this
screening mammogram will be mailed directly to the patient.

RECOMMENDATION:
Screening mammogram in one year. (Code:Q3-W-BC3)

BI-RADS CATEGORY  1: Negative.

## 2023-07-17 ENCOUNTER — Other Ambulatory Visit (HOSPITAL_COMMUNITY): Payer: Self-pay | Admitting: Family Medicine

## 2023-07-17 DIAGNOSIS — Z1231 Encounter for screening mammogram for malignant neoplasm of breast: Secondary | ICD-10-CM

## 2023-07-21 ENCOUNTER — Ambulatory Visit (HOSPITAL_COMMUNITY)
Admission: RE | Admit: 2023-07-21 | Discharge: 2023-07-21 | Disposition: A | Discharge status: Home / Self Care | Payer: BC Managed Care – PPO | Source: Ambulatory Visit | Attending: Family Medicine | Admitting: Family Medicine

## 2023-07-21 DIAGNOSIS — Z1231 Encounter for screening mammogram for malignant neoplasm of breast: Secondary | ICD-10-CM | POA: Insufficient documentation
